# Patient Record
Sex: Female | Born: 1995 | Race: White | Hispanic: No | Marital: Single | Smoking: Never smoker
Health system: Southern US, Community
[De-identification: ages and names within clinical notes are randomized; demographics above are authoritative.]

---
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
## 2017-03-31 DIAGNOSIS — N946 Dysmenorrhea, unspecified: Secondary | ICD-10-CM | POA: Diagnosis not present

## 2017-05-07 DIAGNOSIS — Z0000 Encounter for general adult medical examination without abnormal findings: Secondary | ICD-10-CM | POA: Diagnosis not present

## 2017-07-23 DIAGNOSIS — Z23 Encounter for immunization: Secondary | ICD-10-CM | POA: Diagnosis not present

## 2018-03-02 DIAGNOSIS — Z23 Encounter for immunization: Secondary | ICD-10-CM | POA: Diagnosis not present

## 2018-03-24 DIAGNOSIS — Z01419 Encounter for gynecological examination (general) (routine) without abnormal findings: Secondary | ICD-10-CM | POA: Diagnosis not present

## 2018-04-02 DIAGNOSIS — Z7189 Other specified counseling: Secondary | ICD-10-CM | POA: Diagnosis not present

## 2018-06-24 DIAGNOSIS — Z23 Encounter for immunization: Secondary | ICD-10-CM | POA: Diagnosis not present
